# Patient Record
Sex: Female | Born: 1970 | Race: White | Hispanic: No | Marital: Married | Smoking: Never smoker
Health system: Southern US, Community
[De-identification: ages and names within clinical notes are randomized; demographics above are authoritative.]

## PROBLEM LIST (undated history)

## (undated) DIAGNOSIS — G43909 Migraine, unspecified, not intractable, without status migrainosus: Secondary | ICD-10-CM

## (undated) DIAGNOSIS — E282 Polycystic ovarian syndrome: Secondary | ICD-10-CM

## (undated) HISTORY — PX: TONSILLECTOMY: SUR1361

---

## 1997-12-20 ENCOUNTER — Ambulatory Visit (HOSPITAL_COMMUNITY): Admission: RE | Admit: 1997-12-20 | Discharge: 1997-12-20 | Payer: Self-pay | Admitting: Obstetrics and Gynecology

## 1997-12-20 ENCOUNTER — Inpatient Hospital Stay (HOSPITAL_COMMUNITY): Admission: AD | Admit: 1997-12-20 | Discharge: 1997-12-20 | Payer: Self-pay | Admitting: Obstetrics and Gynecology

## 1998-01-09 ENCOUNTER — Inpatient Hospital Stay (HOSPITAL_COMMUNITY): Admission: AD | Admit: 1998-01-09 | Discharge: 1998-01-11 | Payer: Self-pay | Admitting: Obstetrics and Gynecology

## 1998-02-06 ENCOUNTER — Other Ambulatory Visit: Admission: RE | Admit: 1998-02-06 | Discharge: 1998-02-06 | Payer: Self-pay | Admitting: Obstetrics and Gynecology

## 1999-02-06 ENCOUNTER — Other Ambulatory Visit: Admission: RE | Admit: 1999-02-06 | Discharge: 1999-02-06 | Payer: Self-pay | Admitting: Obstetrics and Gynecology

## 1999-09-24 ENCOUNTER — Other Ambulatory Visit: Admission: RE | Admit: 1999-09-24 | Discharge: 1999-09-24 | Payer: Self-pay | Admitting: Obstetrics and Gynecology

## 2000-03-15 ENCOUNTER — Inpatient Hospital Stay (HOSPITAL_COMMUNITY): Admission: AD | Admit: 2000-03-15 | Discharge: 2000-03-15 | Payer: Self-pay | Admitting: Obstetrics and Gynecology

## 2000-04-18 ENCOUNTER — Inpatient Hospital Stay (HOSPITAL_COMMUNITY): Admission: AD | Admit: 2000-04-18 | Discharge: 2000-04-21 | Payer: Self-pay | Admitting: Obstetrics and Gynecology

## 2000-04-22 ENCOUNTER — Encounter: Admission: RE | Admit: 2000-04-22 | Discharge: 2000-06-22 | Payer: Self-pay | Admitting: Obstetrics and Gynecology

## 2000-05-24 ENCOUNTER — Other Ambulatory Visit: Admission: RE | Admit: 2000-05-24 | Discharge: 2000-05-24 | Payer: Self-pay | Admitting: Obstetrics and Gynecology

## 2001-09-14 ENCOUNTER — Other Ambulatory Visit: Admission: RE | Admit: 2001-09-14 | Discharge: 2001-09-14 | Payer: Self-pay | Admitting: Obstetrics and Gynecology

## 2002-09-22 ENCOUNTER — Other Ambulatory Visit: Admission: RE | Admit: 2002-09-22 | Discharge: 2002-09-22 | Payer: Self-pay | Admitting: Obstetrics and Gynecology

## 2003-10-04 ENCOUNTER — Other Ambulatory Visit: Admission: RE | Admit: 2003-10-04 | Discharge: 2003-10-04 | Payer: Self-pay | Admitting: Obstetrics and Gynecology

## 2005-01-19 ENCOUNTER — Other Ambulatory Visit: Admission: RE | Admit: 2005-01-19 | Discharge: 2005-01-19 | Payer: Self-pay | Admitting: Obstetrics and Gynecology

## 2009-05-30 ENCOUNTER — Encounter: Admission: RE | Admit: 2009-05-30 | Discharge: 2009-05-30 | Payer: Self-pay | Admitting: Obstetrics and Gynecology

## 2012-02-24 ENCOUNTER — Other Ambulatory Visit: Payer: Self-pay | Admitting: Family Medicine

## 2012-02-24 DIAGNOSIS — R1011 Right upper quadrant pain: Secondary | ICD-10-CM

## 2012-02-26 ENCOUNTER — Ambulatory Visit
Admission: RE | Admit: 2012-02-26 | Discharge: 2012-02-26 | Disposition: A | Discharge status: Home / Self Care | Payer: BC Managed Care – PPO | Source: Ambulatory Visit | Attending: Family Medicine | Admitting: Family Medicine

## 2012-02-26 DIAGNOSIS — R1011 Right upper quadrant pain: Secondary | ICD-10-CM

## 2012-03-15 ENCOUNTER — Other Ambulatory Visit: Payer: Self-pay | Admitting: Family Medicine

## 2012-03-15 DIAGNOSIS — N1330 Unspecified hydronephrosis: Secondary | ICD-10-CM

## 2012-03-16 ENCOUNTER — Ambulatory Visit
Admission: RE | Admit: 2012-03-16 | Discharge: 2012-03-16 | Disposition: A | Discharge status: Home / Self Care | Payer: BC Managed Care – PPO | Source: Ambulatory Visit | Attending: Family Medicine | Admitting: Family Medicine

## 2012-03-16 DIAGNOSIS — N1330 Unspecified hydronephrosis: Secondary | ICD-10-CM

## 2013-06-18 ENCOUNTER — Emergency Department (HOSPITAL_COMMUNITY)
Admission: EM | Admit: 2013-06-18 | Discharge: 2013-06-18 | Disposition: A | Discharge status: Home / Self Care | Payer: BC Managed Care – PPO | Attending: Emergency Medicine | Admitting: Emergency Medicine

## 2013-06-18 ENCOUNTER — Emergency Department (HOSPITAL_COMMUNITY): Payer: BC Managed Care – PPO

## 2013-06-18 ENCOUNTER — Encounter (HOSPITAL_COMMUNITY): Payer: Self-pay | Admitting: Emergency Medicine

## 2013-06-18 DIAGNOSIS — M545 Low back pain, unspecified: Secondary | ICD-10-CM | POA: Insufficient documentation

## 2013-06-18 DIAGNOSIS — Z8742 Personal history of other diseases of the female genital tract: Secondary | ICD-10-CM | POA: Insufficient documentation

## 2013-06-18 DIAGNOSIS — R112 Nausea with vomiting, unspecified: Secondary | ICD-10-CM | POA: Insufficient documentation

## 2013-06-18 DIAGNOSIS — R109 Unspecified abdominal pain: Secondary | ICD-10-CM | POA: Insufficient documentation

## 2013-06-18 DIAGNOSIS — IMO0002 Reserved for concepts with insufficient information to code with codable children: Secondary | ICD-10-CM | POA: Insufficient documentation

## 2013-06-18 DIAGNOSIS — Z8679 Personal history of other diseases of the circulatory system: Secondary | ICD-10-CM | POA: Insufficient documentation

## 2013-06-18 DIAGNOSIS — Z3202 Encounter for pregnancy test, result negative: Secondary | ICD-10-CM | POA: Insufficient documentation

## 2013-06-18 HISTORY — DX: Migraine, unspecified, not intractable, without status migrainosus: G43.909

## 2013-06-18 HISTORY — DX: Polycystic ovarian syndrome: E28.2

## 2013-06-18 LAB — CBC WITH DIFFERENTIAL/PLATELET
BASOS ABS: 0 10*3/uL (ref 0.0–0.1)
BASOS PCT: 0 % (ref 0–1)
EOS PCT: 1 % (ref 0–5)
Eosinophils Absolute: 0.1 10*3/uL (ref 0.0–0.7)
HEMATOCRIT: 43 % (ref 36.0–46.0)
HEMOGLOBIN: 14.5 g/dL (ref 12.0–15.0)
LYMPHS PCT: 17 % (ref 12–46)
Lymphs Abs: 1.9 10*3/uL (ref 0.7–4.0)
MCH: 29.7 pg (ref 26.0–34.0)
MCHC: 33.7 g/dL (ref 30.0–36.0)
MCV: 88.1 fL (ref 78.0–100.0)
Monocytes Absolute: 0.6 10*3/uL (ref 0.1–1.0)
Monocytes Relative: 5 % (ref 3–12)
NEUTROS ABS: 8.7 10*3/uL — AB (ref 1.7–7.7)
NEUTROS PCT: 77 % (ref 43–77)
Platelets: 235 10*3/uL (ref 150–400)
RBC: 4.88 MIL/uL (ref 3.87–5.11)
RDW: 13 % (ref 11.5–15.5)
WBC: 11.4 10*3/uL — ABNORMAL HIGH (ref 4.0–10.5)

## 2013-06-18 LAB — I-STAT CHEM 8, ED
BUN: 10 mg/dL (ref 6–23)
CREATININE: 0.9 mg/dL (ref 0.50–1.10)
Calcium, Ion: 1.24 mmol/L — ABNORMAL HIGH (ref 1.12–1.23)
Chloride: 101 mEq/L (ref 96–112)
Glucose, Bld: 102 mg/dL — ABNORMAL HIGH (ref 70–99)
HCT: 45 % (ref 36.0–46.0)
HEMOGLOBIN: 15.3 g/dL — AB (ref 12.0–15.0)
POTASSIUM: 4 meq/L (ref 3.7–5.3)
SODIUM: 141 meq/L (ref 137–147)
TCO2: 29 mmol/L (ref 0–100)

## 2013-06-18 LAB — URINALYSIS, ROUTINE W REFLEX MICROSCOPIC
GLUCOSE, UA: NEGATIVE mg/dL
KETONES UR: 15 mg/dL — AB
NITRITE: NEGATIVE
PH: 6.5 (ref 5.0–8.0)
PROTEIN: 30 mg/dL — AB
SPECIFIC GRAVITY, URINE: 1.029 (ref 1.005–1.030)
Urobilinogen, UA: 1 mg/dL (ref 0.0–1.0)

## 2013-06-18 LAB — URINE MICROSCOPIC-ADD ON

## 2013-06-18 LAB — PREGNANCY, URINE: PREG TEST UR: NEGATIVE

## 2013-06-18 MED ORDER — HYDROMORPHONE HCL PF 1 MG/ML IJ SOLN
1.0000 mg | Freq: Once | INTRAMUSCULAR | Status: AC
  Administered 2013-06-18: 1 mg via INTRAVENOUS
  Filled 2013-06-18: qty 1

## 2013-06-18 MED ORDER — HYDROMORPHONE HCL PF 1 MG/ML IJ SOLN
0.5000 mg | Freq: Once | INTRAMUSCULAR | Status: AC
  Administered 2013-06-18: 0.5 mg via INTRAVENOUS
  Filled 2013-06-18: qty 1

## 2013-06-18 MED ORDER — ONDANSETRON 8 MG PO TBDP: 8.0000 mg | ORAL_TABLET | Freq: Three times a day (TID) | ORAL | Status: AC | PRN

## 2013-06-18 MED ORDER — ONDANSETRON HCL 4 MG/2ML IJ SOLN
4.0000 mg | Freq: Once | INTRAMUSCULAR | Status: AC
  Administered 2013-06-18: 4 mg via INTRAVENOUS
  Filled 2013-06-18: qty 2

## 2013-06-18 MED ORDER — PROMETHAZINE HCL 25 MG/ML IJ SOLN
25.0000 mg | Freq: Once | INTRAMUSCULAR | Status: AC
  Administered 2013-06-18: 25 mg via INTRAVENOUS
  Filled 2013-06-18: qty 1

## 2013-06-18 MED ORDER — OXYCODONE-ACETAMINOPHEN 5-325 MG PO TABS: 1.0000 | ORAL_TABLET | Freq: Four times a day (QID) | ORAL | Status: AC | PRN

## 2013-06-18 MED ORDER — KETOROLAC TROMETHAMINE 30 MG/ML IJ SOLN
30.0000 mg | Freq: Once | INTRAMUSCULAR | Status: AC
  Administered 2013-06-18: 30 mg via INTRAVENOUS
  Filled 2013-06-18: qty 1

## 2013-06-18 NOTE — ED Notes (Signed)
Pt ambulated to restroom with no difficulty.  Husband by side.

## 2013-06-18 NOTE — ED Notes (Signed)
Pt reports about 1 hr ago developed sharp left sided flank pain. Had episode of vomiting. Normal bowel movement. No problems with urination. Pt tried to take a bath at home, with no relief of pain. States pain is now dull instead of sharp. Pt pale. Holding Left flank.

## 2013-06-18 NOTE — ED Notes (Signed)
Pt became nauseated after administration of toradol.  Pt started throwing up with RN at bedside.  EDP made aware.  Phenergan to be ordered.

## 2013-06-18 NOTE — ED Provider Notes (Signed)
CSN: [id number]     Arrival date & time 06/18/13  1543 History   First MD Initiated Contact with Patient 06/18/13 1613     Chief Complaint  Patient presents with  . Flank Pain     (Consider location/radiation/quality/duration/timing/severity/associated sxs/prior Treatment) Patient is a 43 y.o. female presenting with flank pain. The history is provided by the patient.  Flank Pain This is a new problem. Pertinent negatives include no chest pain, no abdominal pain, no headaches and no shortness of breath.  patient presents with left lower back pain that radiates to the front. Began prior to arrival. No relief with that, although it has changed from a sharp to dull pain. No fevers. No dysuria. She's had a normal bowel movement. No history of known kidney stones, however there was once where she had right upper quadrant pain that showed some swollen kidney that resolved and it was considered likely kidney stone. She has never had a CT scan for it. She's had nausea and vomiting due to the pain. The pain came on while she was cleaning the house. She denies dysuria. She denies vaginal bleeding or discharge or  Past Medical History  Diagnosis Date  . Polycystic ovaries   . Migraines    Past Surgical History  Procedure Laterality Date  . Tonsillectomy     No family history on file. History  Substance Use Topics  . Smoking status: Never Smoker   . Smokeless tobacco: Not on file  . Alcohol Use: No   OB History   Grav Para Term Preterm Abortions TAB SAB Ect Mult Living                 Review of Systems  Constitutional: Negative for activity change and appetite change.  Eyes: Negative for pain.  Respiratory: Negative for chest tightness and shortness of breath.   Cardiovascular: Negative for chest pain and leg swelling.  Gastrointestinal: Positive for nausea and vomiting. Negative for abdominal pain and diarrhea.  Genitourinary: Positive for flank pain.  Musculoskeletal: Negative for back  pain and neck stiffness.  Skin: Negative for rash.  Neurological: Negative for weakness, numbness and headaches.  Psychiatric/Behavioral: Negative for behavioral problems.      Allergies  Review of patient's allergies indicates no known allergies.  Home Medications   Current Outpatient Rx  Name  Route  Sig  Dispense  Refill  . fluticasone (FLONASE) 50 MCG/ACT nasal spray   Each Nare   Place 1 spray into both nostrils daily.         Marland Kitchen ibuprofen (ADVIL,MOTRIN) 200 MG tablet   Oral   Take 400 mg by mouth every 6 (six) hours as needed.         . ondansetron (ZOFRAN-ODT) 8 MG disintegrating tablet   Oral   Take 1 tablet (8 mg total) by mouth every 8 (eight) hours as needed for nausea or vomiting.   10 tablet   0   . oxyCODONE-acetaminophen (PERCOCET/ROXICET) 5-325 MG per tablet   Oral   Take 1-2 tablets by mouth every 6 (six) hours as needed for severe pain.   10 tablet   0    BP 117/72  Pulse 82  Temp(Src) 98.1 F (36.7 C) (Oral)  Resp 18  Wt 170 lb (77.111 kg)  SpO2 100%  LMP 05/21/2013 Physical Exam  Nursing note and vitals reviewed. Constitutional: She is oriented to person, place, and time. She appears well-developed and well-nourished.  Patient appears uncomfortable  HENT:  Head: Normocephalic  and atraumatic.  Eyes: EOM are normal. Pupils are equal, round, and reactive to light.  Neck: Normal range of motion. Neck supple.  Cardiovascular: Normal rate, regular rhythm and normal heart sounds.   No murmur heard. Pulmonary/Chest: Effort normal and breath sounds normal. No respiratory distress. She has no wheezes. She has no rales.  Abdominal: Soft. Bowel sounds are normal. She exhibits no distension. There is no tenderness. There is no rebound and no guarding.  Genitourinary:  Left CVA tenderness.   Musculoskeletal: Normal range of motion.  Neurological: She is alert and oriented to person, place, and time. No cranial nerve deficit.  Skin: Skin is warm and  dry.  Psychiatric: She has a normal mood and affect. Her speech is normal.    ED Course  Procedures (including critical care time) Labs Review Labs Reviewed  CBC WITH DIFFERENTIAL - Abnormal; Notable for the following:    WBC 11.4 (*)    Neutro Abs 8.7 (*)    All other components within normal limits  URINALYSIS, ROUTINE W REFLEX MICROSCOPIC - Abnormal; Notable for the following:    APPearance CLOUDY (*)    Hgb urine dipstick SMALL (*)    Bilirubin Urine SMALL (*)    Ketones, ur 15 (*)    Protein, ur 30 (*)    Leukocytes, UA MODERATE (*)    All other components within normal limits  URINE MICROSCOPIC-ADD ON - Abnormal; Notable for the following:    Squamous Epithelial / LPF FEW (*)    All other components within normal limits  I-STAT CHEM 8, ED - Abnormal; Notable for the following:    Glucose, Bld 102 (*)    Calcium, Ion 1.24 (*)    Hemoglobin 15.3 (*)    All other components within normal limits  PREGNANCY, URINE   Imaging Review Ct Abdomen Pelvis Wo Contrast  06/18/2013   CLINICAL DATA:  Left flank pain and vomiting.  EXAM: CT ABDOMEN AND PELVIS WITHOUT CONTRAST  TECHNIQUE: Multidetector CT imaging of the abdomen and pelvis was performed following the standard protocol without intravenous contrast.  COMPARISON:  US RENAL dated 03/16/2012; US ABDOMEN LIMITED RUQ/ASCITES dated 02/26/2012  FINDINGS: Lung Bases: Dependent atelectasis, greater on the right than left.  Liver: Unenhanced CT was performed per clinician order. Lack of IV contrast limits sensitivity and specificity, especially for evaluation of abdominal/pelvic solid viscera. Grossly normal.  Spleen:  Normal.  Gallbladder:  Normal.  Common bile duct:  Normal.  Pancreas:  Normal.  Adrenal glands:  Normal.  Kidneys: Normal appearance of the kidneys. Ectasia of the left ureter. This can be associated with ascending urinary tract infection. Correlation with urinalysis recommended.  Stomach:  Tiny hiatal hernia.  The stomach is  mostly collapsed.  Small bowel:  Normal.  Colon:   Normal appendix.  Distal colon appears normal.  Pelvic Genitourinary: Physiologic appearance of the uterus and adnexa.  Bones:  No aggressive osseous lesions.  Vasculature: Normal.  Body Wall: Normal.  IMPRESSION: Mild ectasia of the left ureter which can be associated with ascending urinary tract infection. Correlation with urinalysis recommended.   Electronically Signed   By: Andreas NewportGeoffrey  Lamke M.D.   On: 06/18/2013 19:14     EKG Interpretation None      MDM   Final diagnoses:  Flank pain    Patient with left flank pain. Urine shows a little bit of blood. There is mild swelling of the left ureter,. She may have passed a stone. No infection. Will discharge home  to followup as needed.    Juliet Rude. Rubin Payor, MD 06/19/13 4098

## 2013-06-18 NOTE — Discharge Instructions (Signed)

## 2014-01-02 ENCOUNTER — Other Ambulatory Visit: Payer: Self-pay | Admitting: Gastroenterology

## 2014-01-02 DIAGNOSIS — R1033 Periumbilical pain: Secondary | ICD-10-CM

## 2014-01-10 ENCOUNTER — Ambulatory Visit
Admission: RE | Admit: 2014-01-10 | Discharge: 2014-01-10 | Disposition: A | Discharge status: Home / Self Care | Payer: BC Managed Care – PPO | Source: Ambulatory Visit | Attending: Gastroenterology | Admitting: Gastroenterology

## 2014-01-10 DIAGNOSIS — R1033 Periumbilical pain: Secondary | ICD-10-CM

## 2014-01-22 ENCOUNTER — Other Ambulatory Visit (HOSPITAL_COMMUNITY): Payer: Self-pay | Admitting: Gastroenterology

## 2014-01-22 DIAGNOSIS — R140 Abdominal distension (gaseous): Secondary | ICD-10-CM

## 2014-01-31 ENCOUNTER — Ambulatory Visit (HOSPITAL_COMMUNITY)
Admission: RE | Admit: 2014-01-31 | Discharge: 2014-01-31 | Disposition: A | Discharge status: Home / Self Care | Payer: BC Managed Care – PPO | Source: Ambulatory Visit | Attending: Gastroenterology | Admitting: Gastroenterology

## 2014-01-31 DIAGNOSIS — R140 Abdominal distension (gaseous): Secondary | ICD-10-CM

## 2014-01-31 DIAGNOSIS — K219 Gastro-esophageal reflux disease without esophagitis: Secondary | ICD-10-CM | POA: Diagnosis not present

## 2014-01-31 MED ORDER — TECHNETIUM TC 99M SULFUR COLLOID
2.0000 | Freq: Once | INTRAVENOUS | Status: AC | PRN
  Administered 2014-01-31: 2 via INTRAVENOUS

## 2014-11-06 IMAGING — US US ABDOMEN LIMITED
1 series · 14 of 25 positions shown · non-contrast
Comparison: None

CLINICAL DATA: Right upper quadrant abdominal pain.

LIMITED ABDOMINAL ULTRASOUND - RIGHT UPPER QUADRANT

[Series 1: us abdomen limited · 0.33mm/px · 14 of 44 slices shown]
[im 1/44]
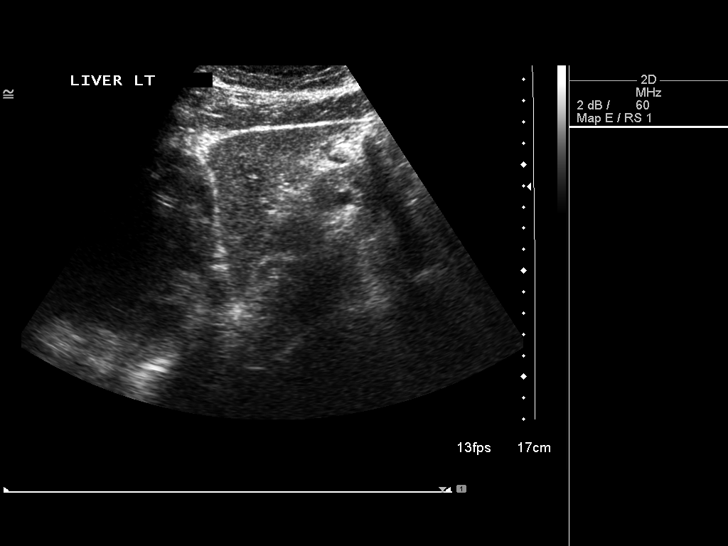
[im 4/44]
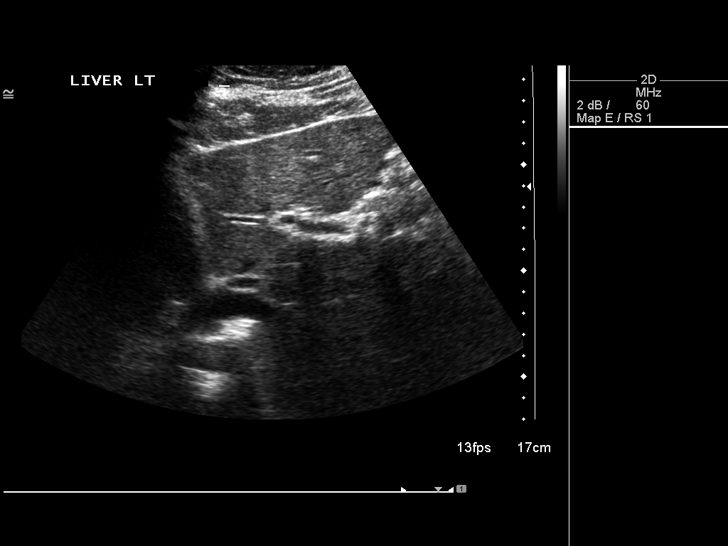
[im 8/44]
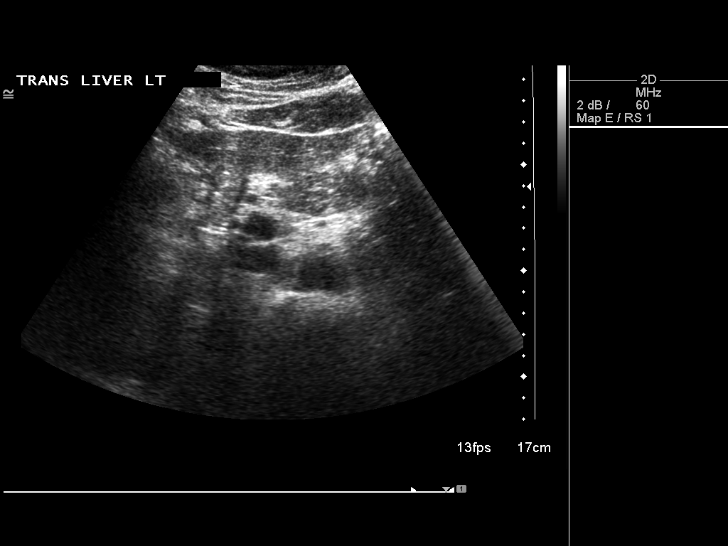
[im 11/44]
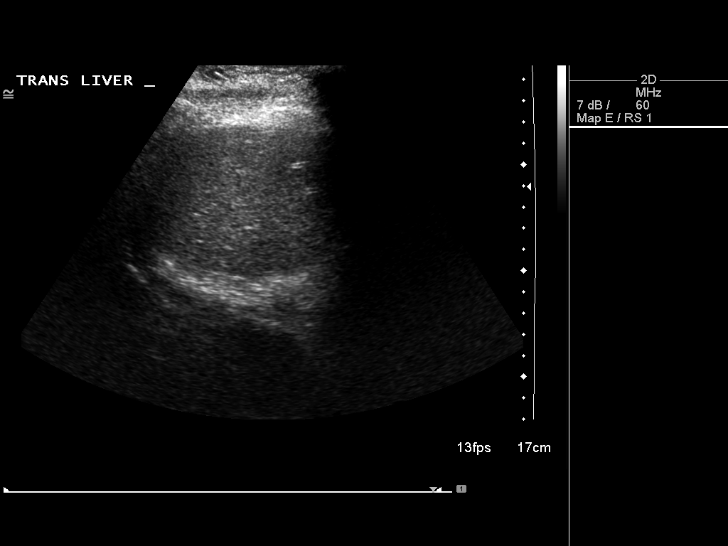
[im 15/44]
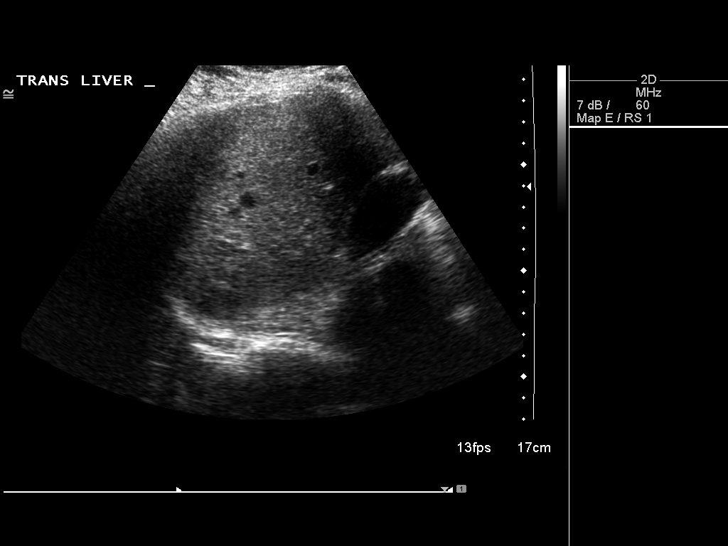
[im 17/44]
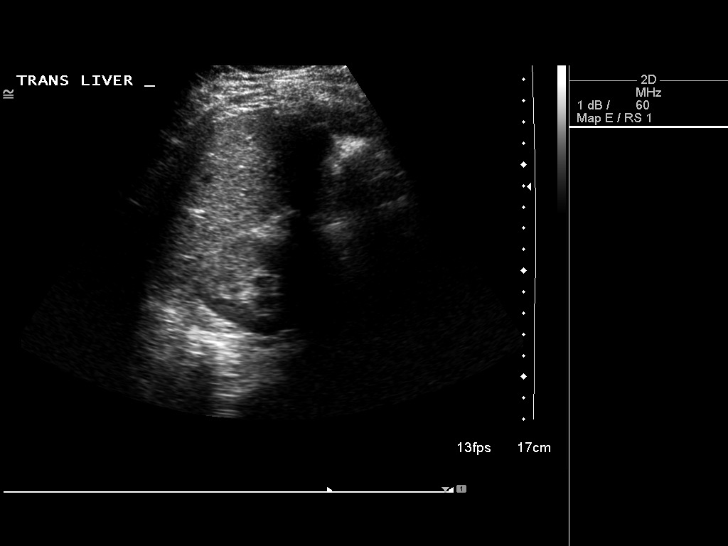
[im 20/44]
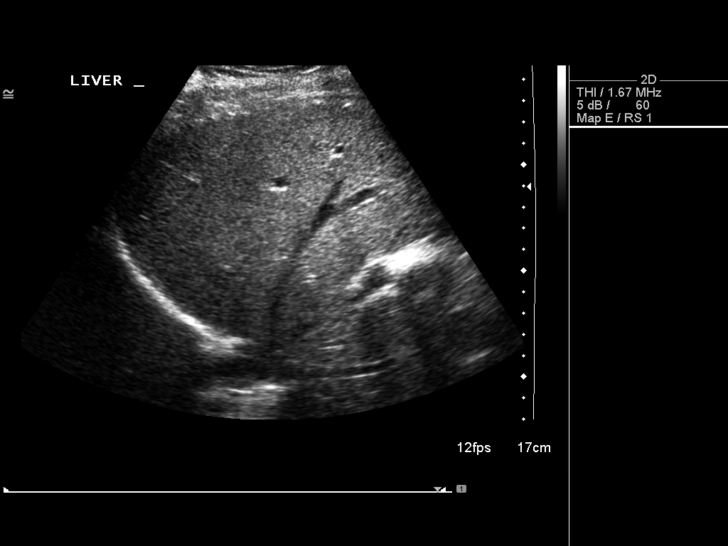
[im 24/44]
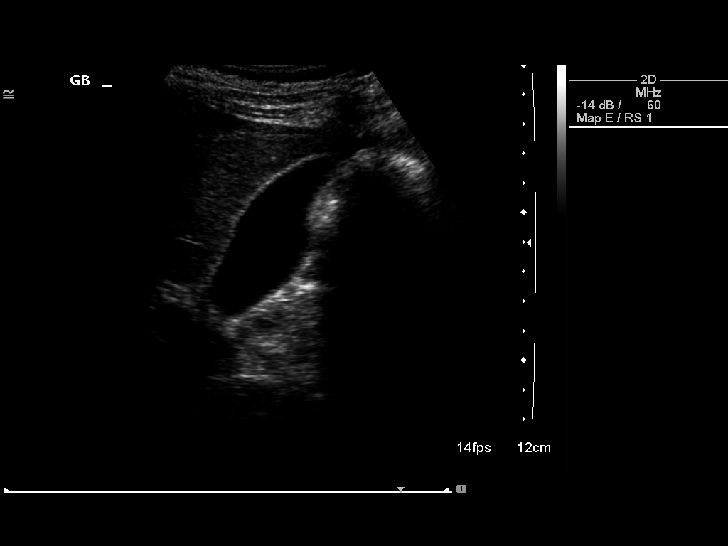
[im 27/44]
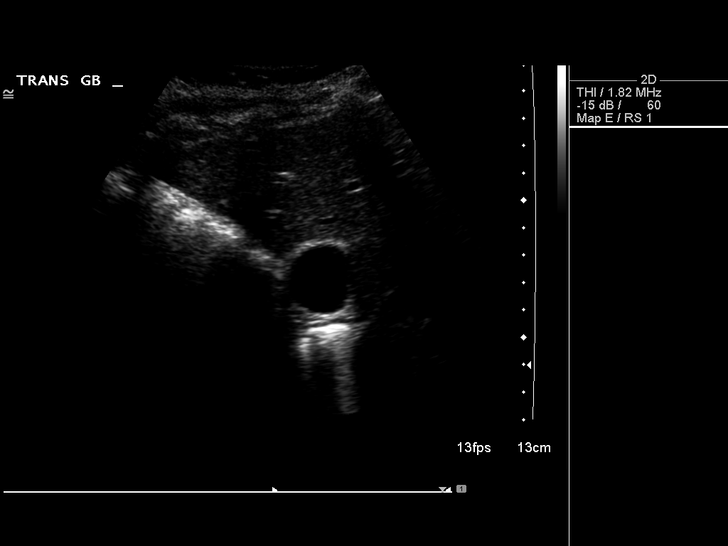
[im 29/44]
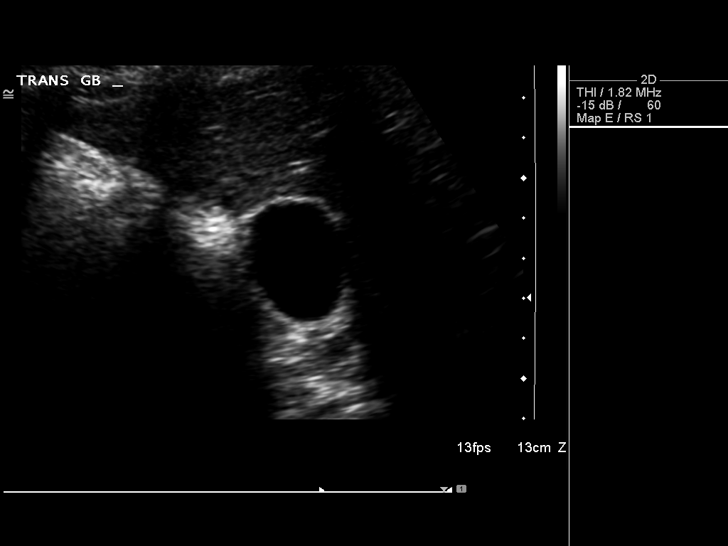
[im 33/44]
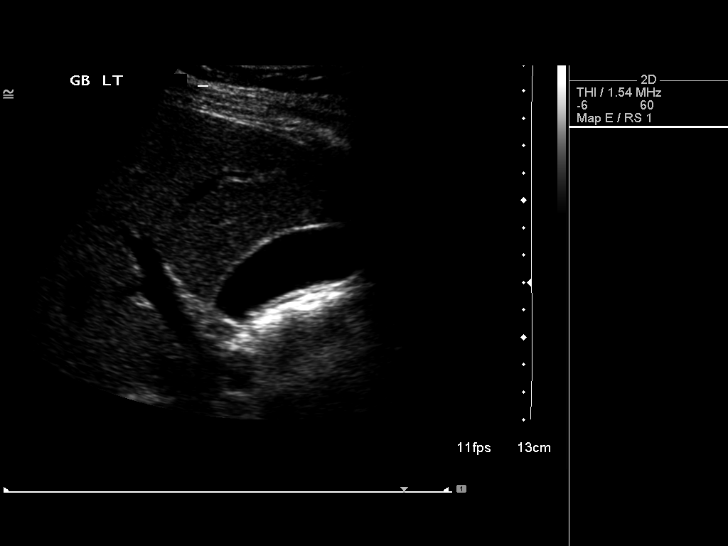
[im 36/44]
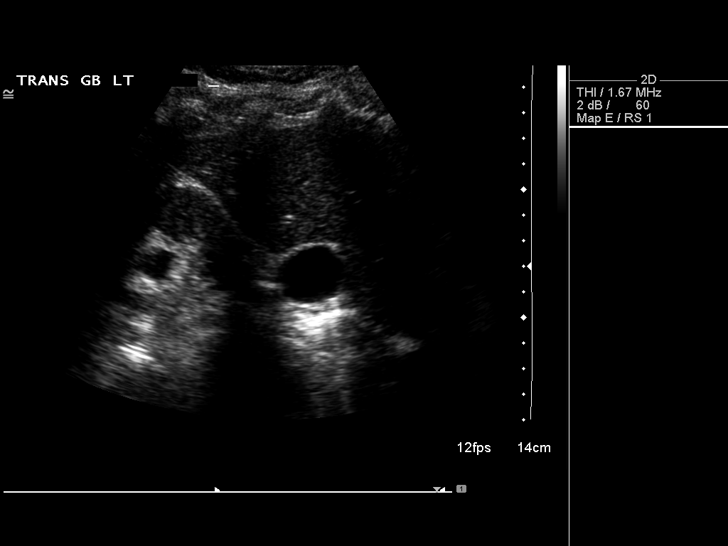
[im 40/44]
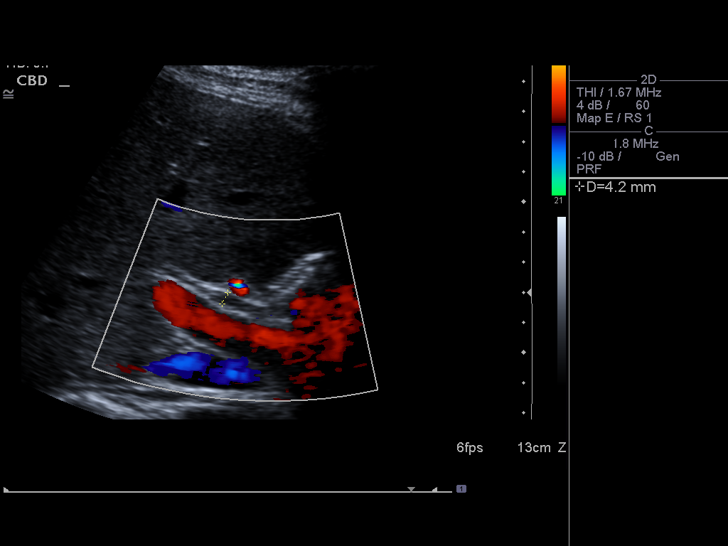
[im 44/44]
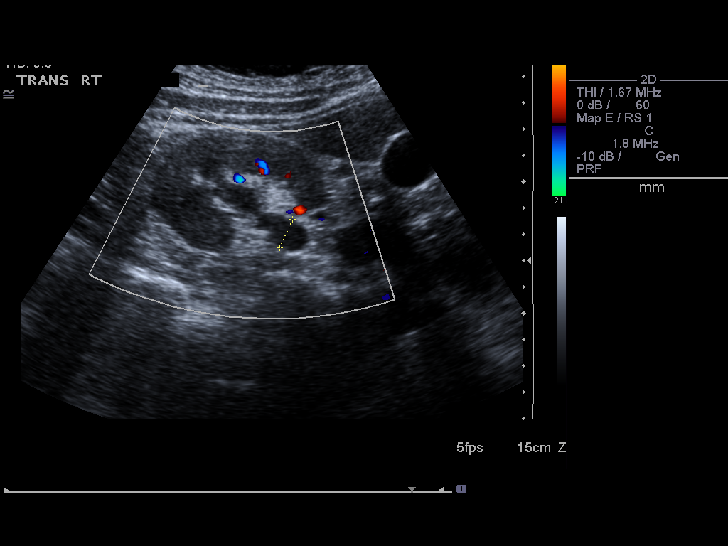

[14 of 25 positions shown; findings below may reference images not displayed]

FINDINGS: Gallbladder:  No gallstones, wall thickening or pericholecystic
fluid.  Negative sonographic Murphy's sign.

Common bile duct:  Normal in caliber measuring a maximum of 4.2mm.

Liver:  The liver is sonographically unremarkable.  There is normal
echogenicity without focal lesions or intrahepatic biliary
dilatation.

Additional findings:  Mild hydronephrosis of the right kidney is
noted.
IMPRESSION: 1.  Normal sonographic appearance of the liver and gallbladder.
2.  Normal caliber common bile duct.
3.  Mild hydronephrosis of the right kidney.

## 2014-11-25 IMAGING — US US RENAL
1 series · 14 of 25 positions shown · non-contrast
Comparison: 02/26/2012

CLINICAL DATA: FOLLOW UP RIGHT HYDRONEPHROSIS

RENAL/URINARY TRACT ULTRASOUND COMPLETE

[Series 1: us renal · 0.24mm/px · 14 of 31 slices shown]
[im 1/31]
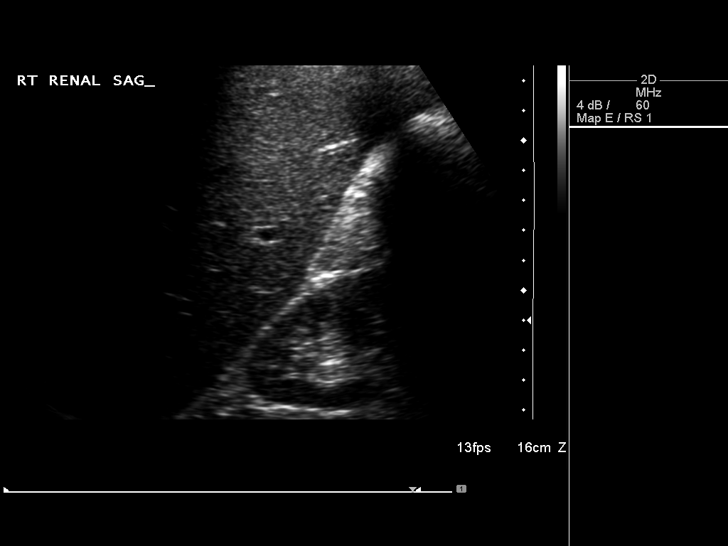
[im 3/31]
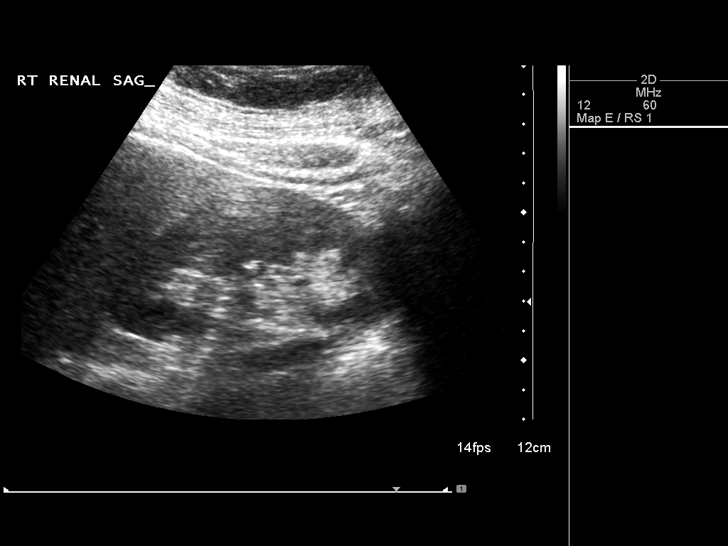
[im 6/31]
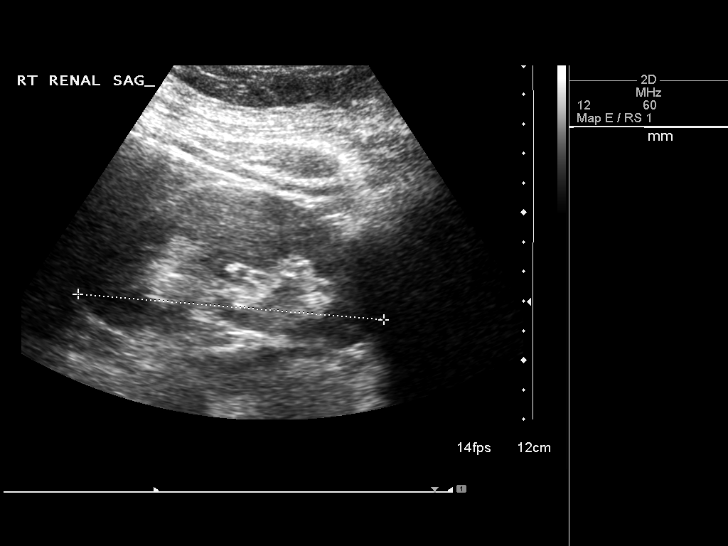
[im 8/31]
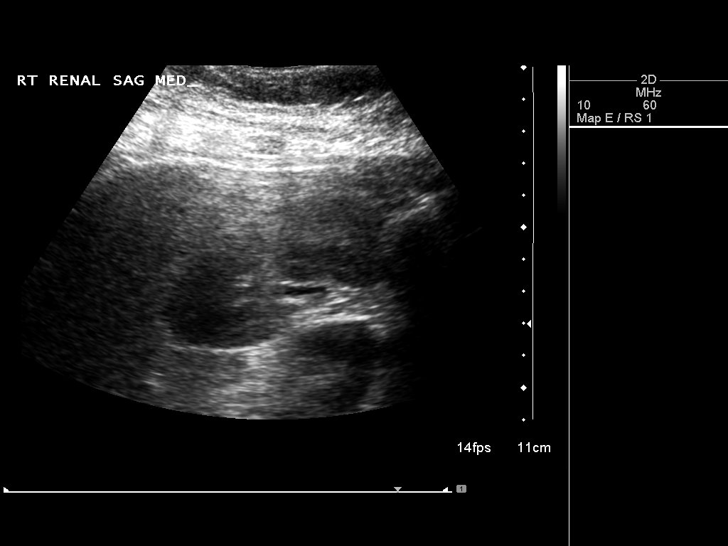
[im 11/31]
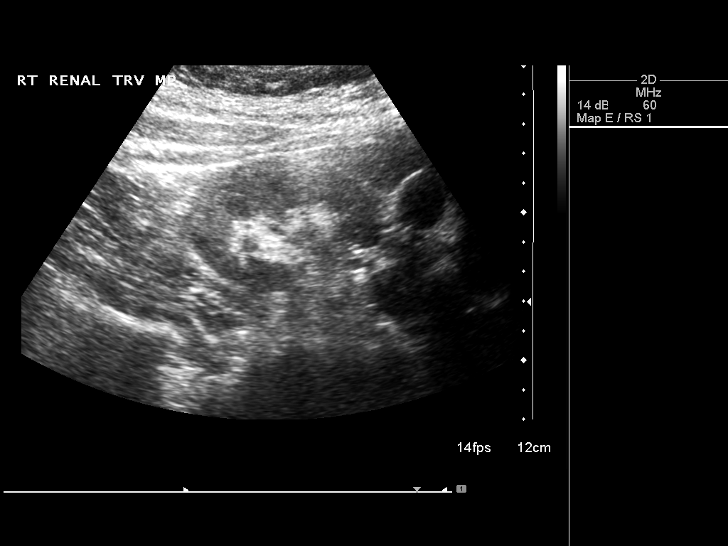
[im 12/31]
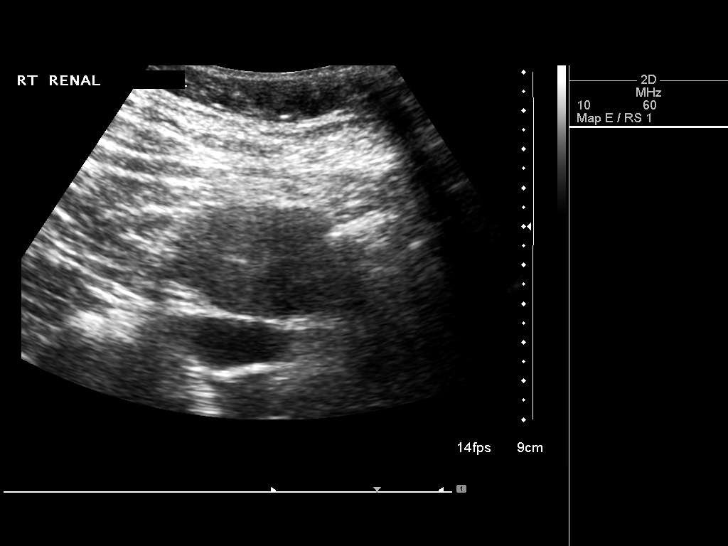
[im 14/31]
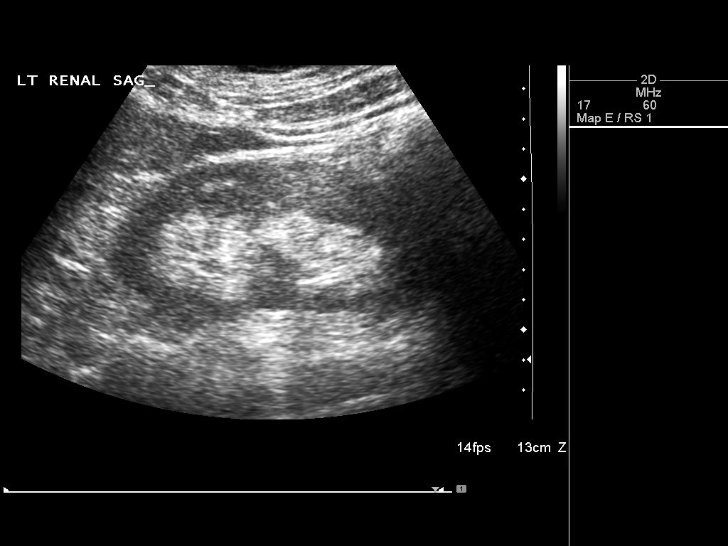
[im 17/31]
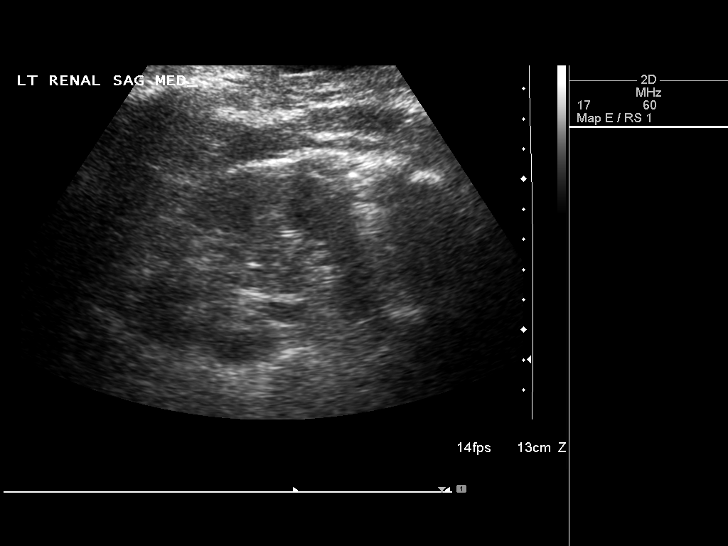
[im 19/31]
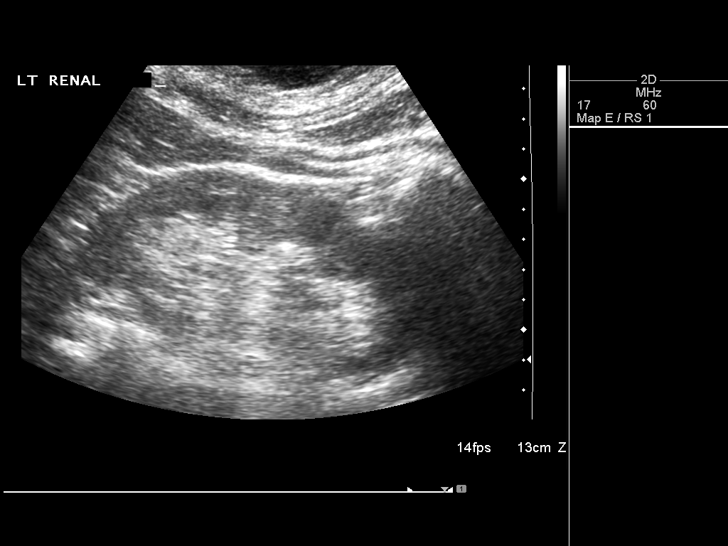
[im 21/31]
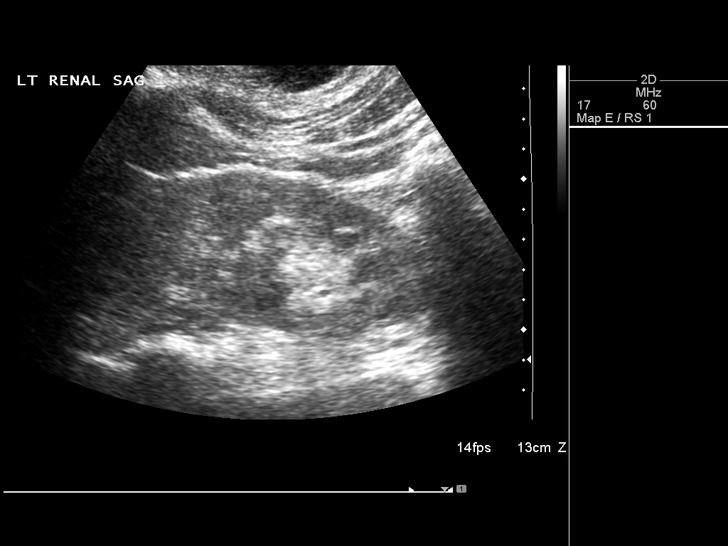
[im 23/31]
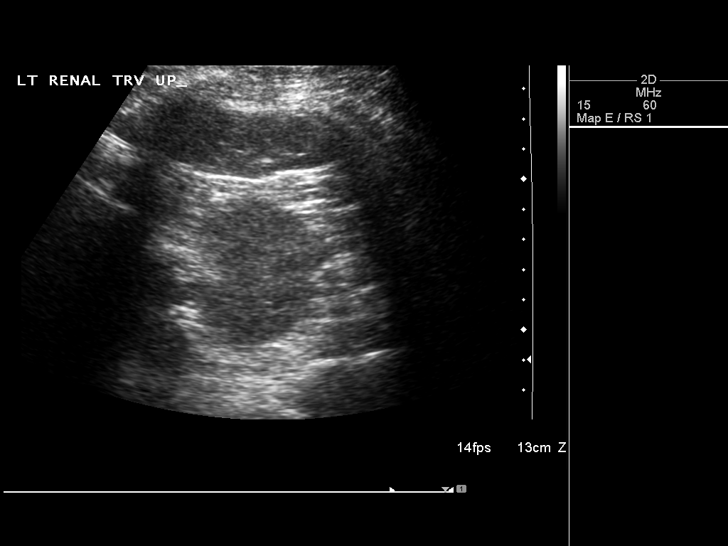
[im 26/31]
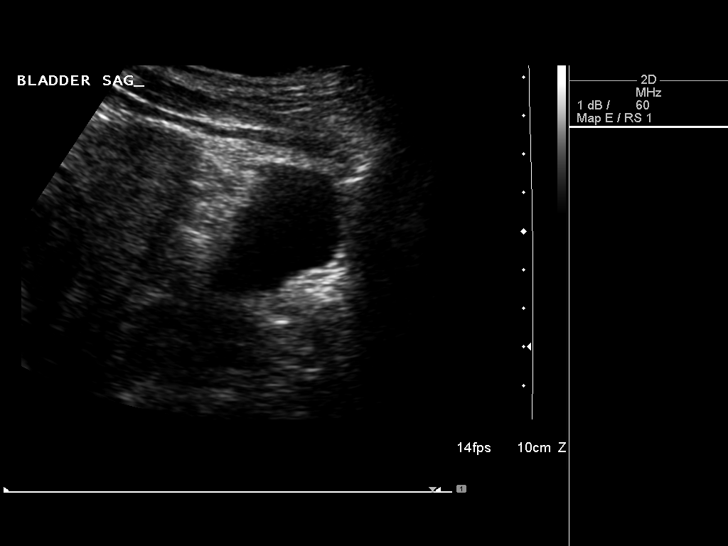
[im 28/31]
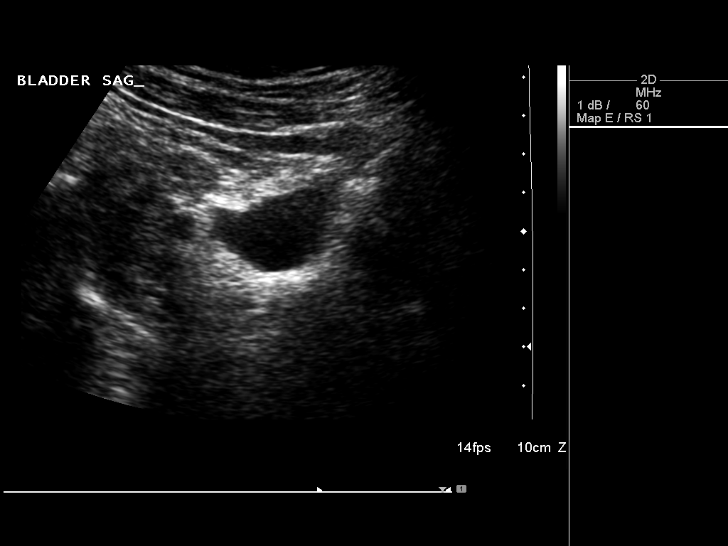
[im 31/31]
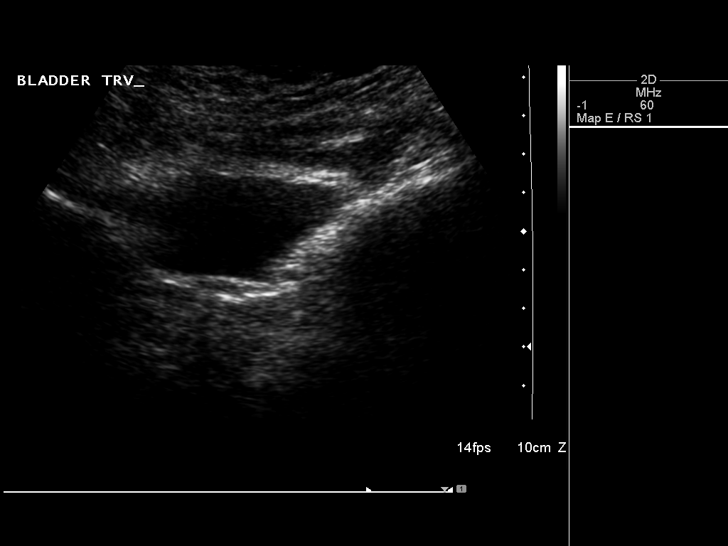

[14 of 25 positions shown; findings below may reference images not displayed]

FINDINGS: Right Kidney:  Measures 10.4 cm in length, with normal echogenicity
and echotexture.  Prior hydronephrosis has resolved.  No calculus,
or mass identified.

Left Kidney:  Measures 11.7 cm in length and likewise appears
normal.

Bladder:  Small in caliber, without mass observed.
IMPRESSION: 1.  Prior right hydronephrosis has resolved.  Both kidneys appear
sonographically normal.

## 2016-04-27 DIAGNOSIS — J329 Chronic sinusitis, unspecified: Secondary | ICD-10-CM | POA: Diagnosis not present

## 2016-09-20 IMAGING — US US ABDOMEN COMPLETE
1 series · 14 of 25 positions shown · non-contrast
Comparison: CT, 06/18/2013

CLINICAL DATA: Periumbilical abdominal pain.

EXAM:
ULTRASOUND ABDOMEN COMPLETE

[Series 1: us abdomen complete · 0.29mm/px · 14 of 84 slices shown]
[im 1/84]
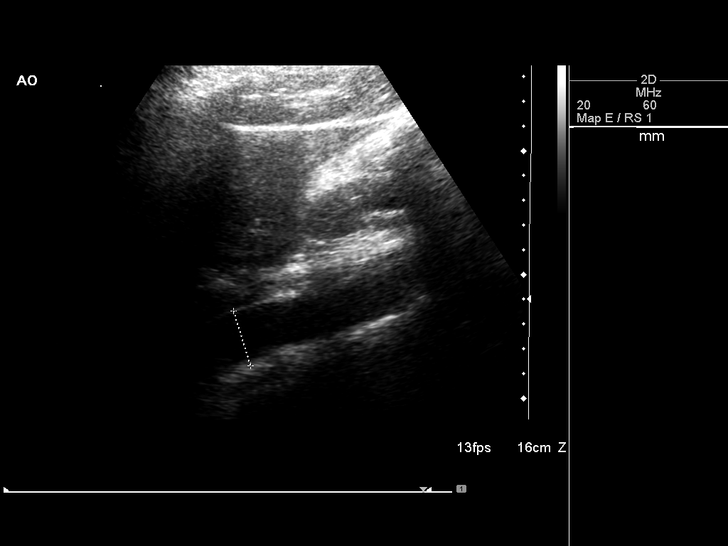
[im 7/84]
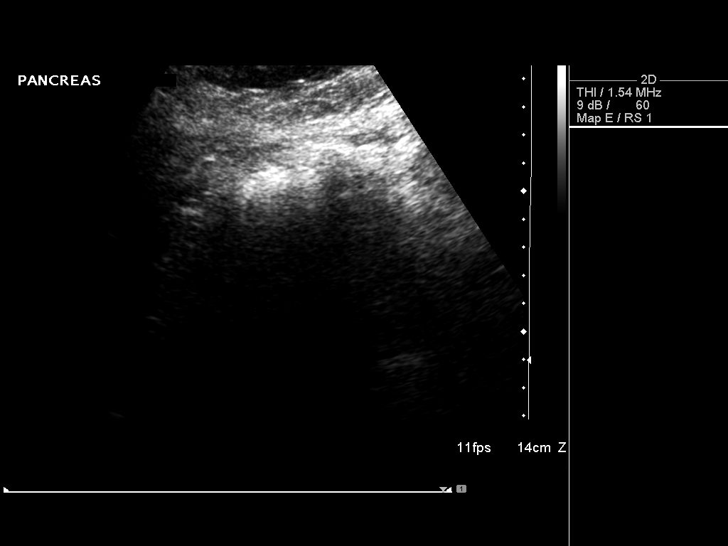
[im 14/84]
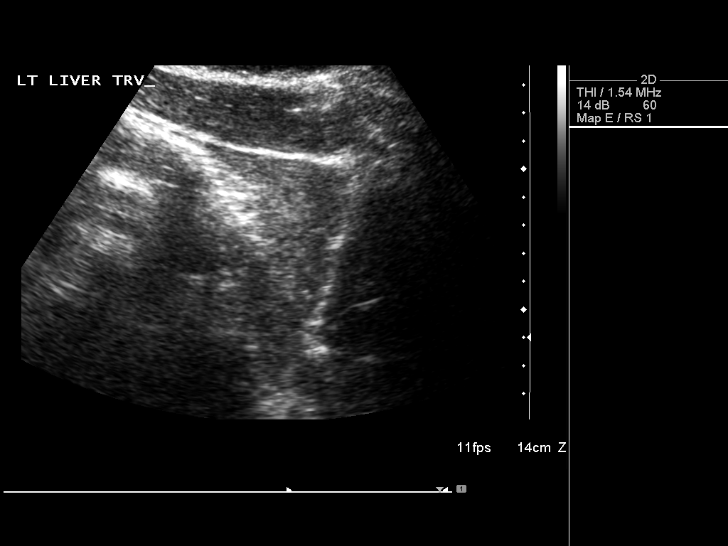
[im 21/84]
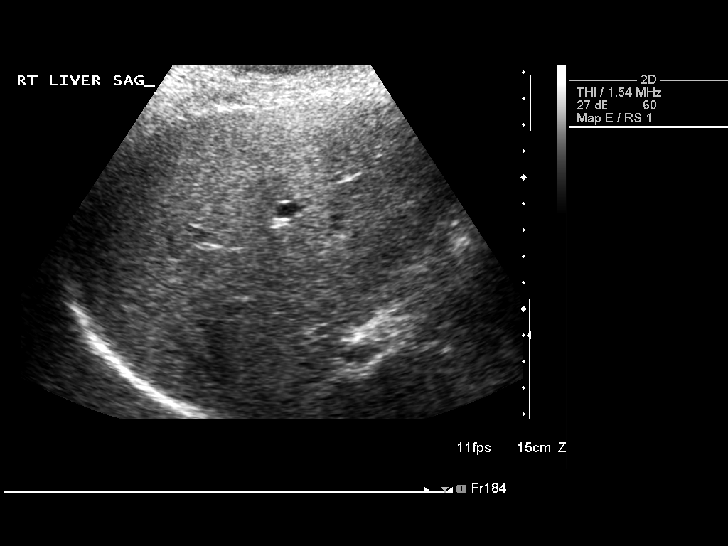
[im 28/84]
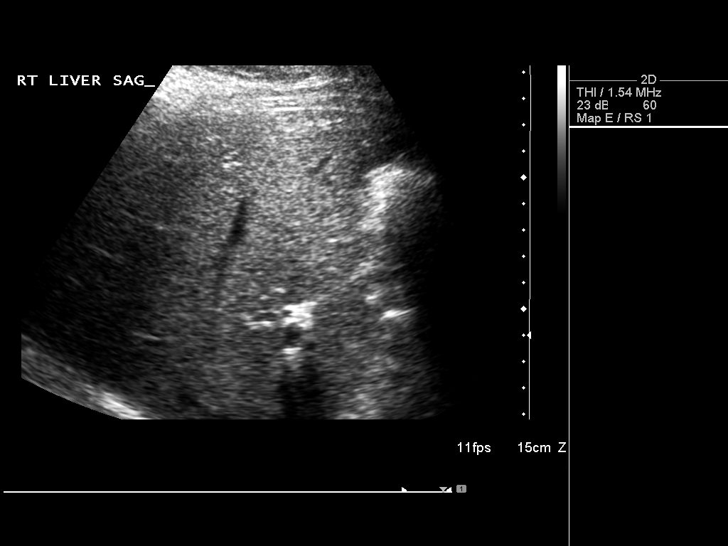
[im 32/84]
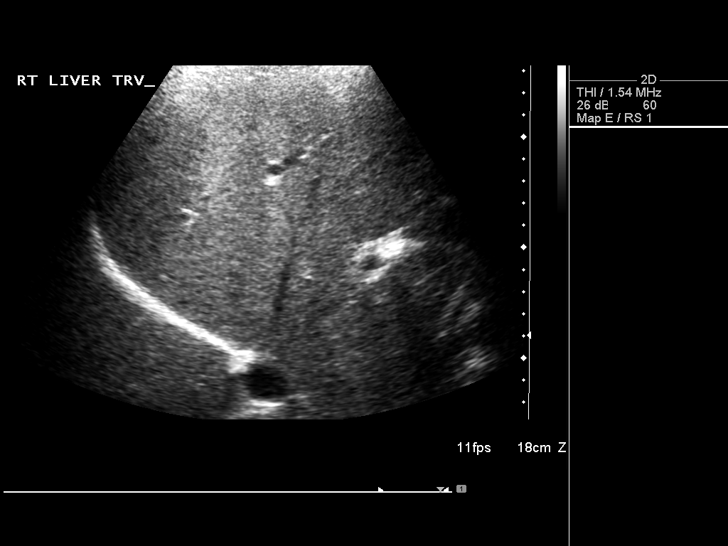
[im 39/84]
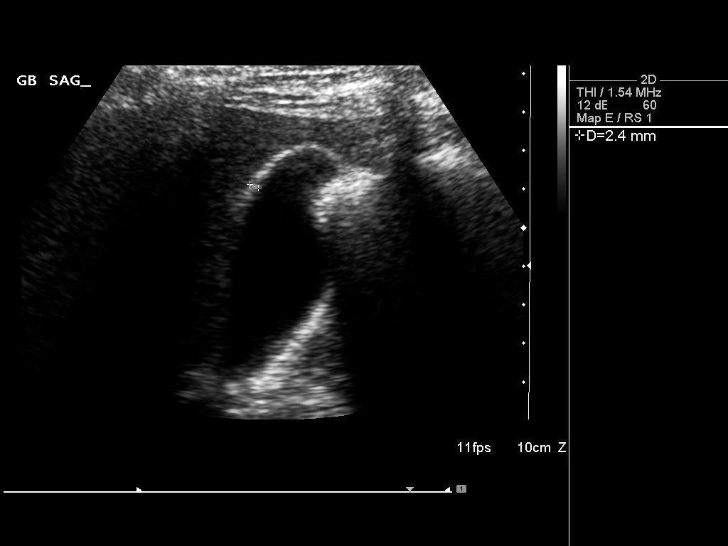
[im 45/84]
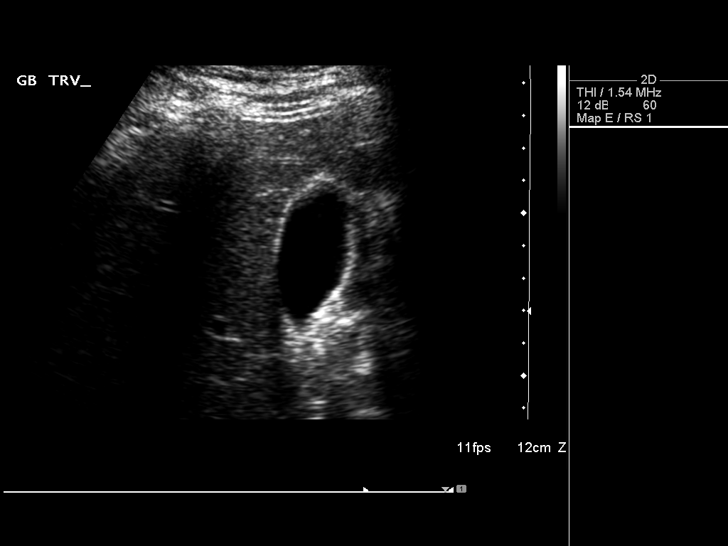
[im 52/84]
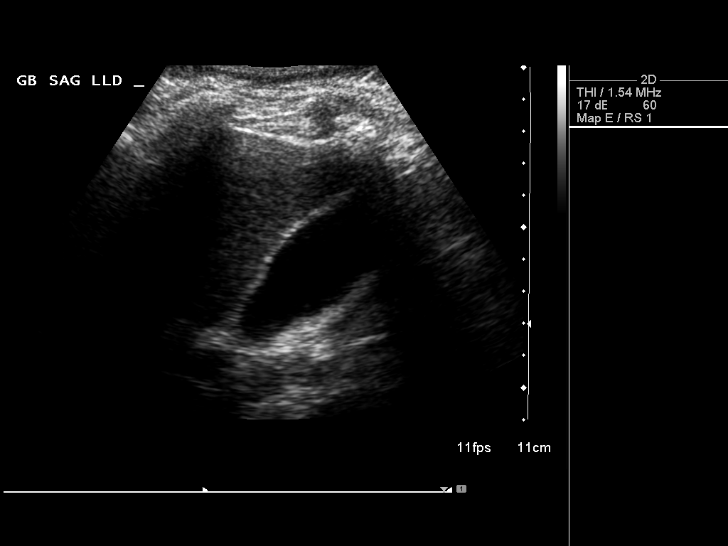
[im 56/84]
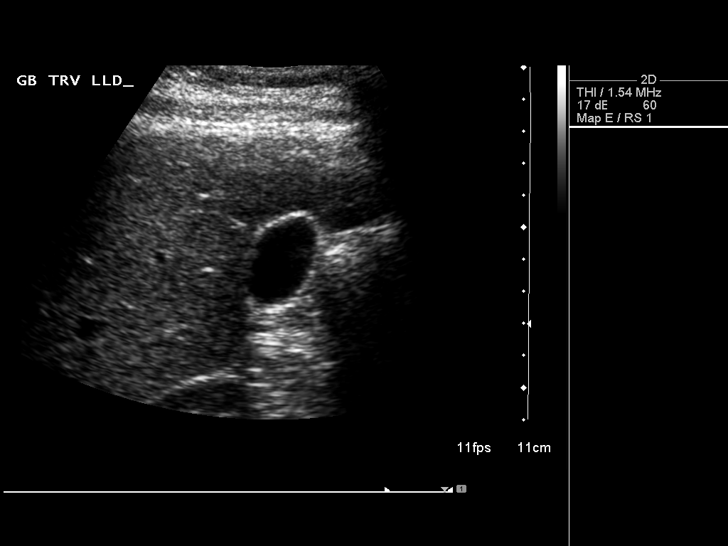
[im 63/84]
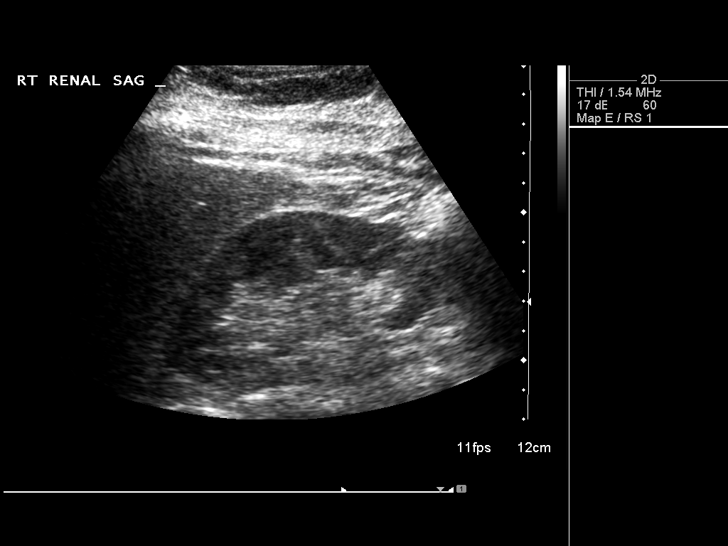
[im 70/84]
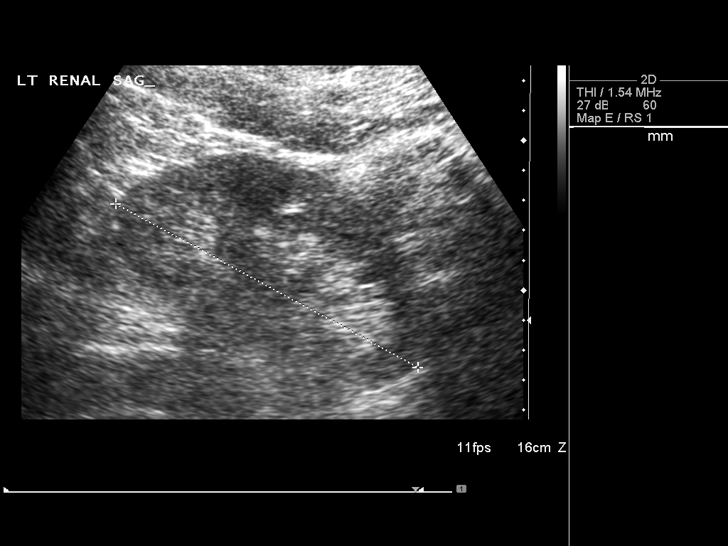
[im 77/84]
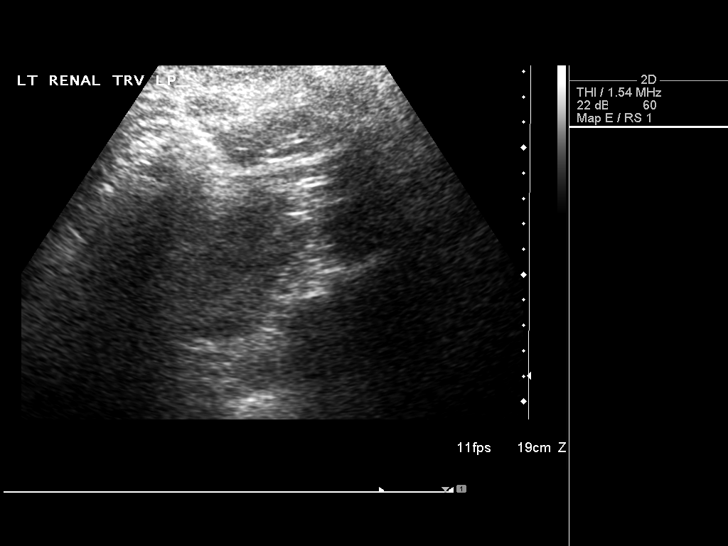
[im 84/84]
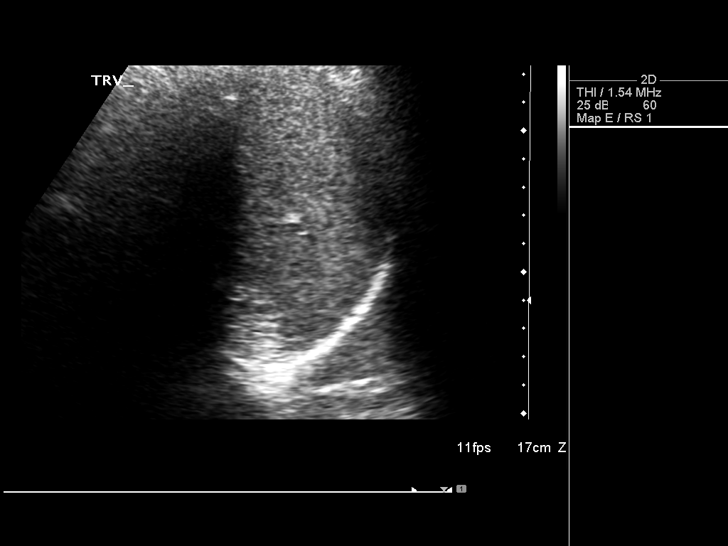

[14 of 25 positions shown; findings below may reference images not displayed]

FINDINGS: Gallbladder:

No gallstones or wall thickening visualized. No sonographic Murphy
sign noted.

Common bile duct:

Diameter: 3.5 mm

Liver:

No focal lesion identified. Within normal limits in parenchymal
echogenicity.

IVC:

No abnormality visualized.

Pancreas:

Visualized portion unremarkable.

Spleen:

Size and appearance within normal limits.

Right Kidney:

Length: 10.5 cm. Echogenicity within normal limits. No mass or
hydronephrosis visualized.

Left Kidney:

Length: 11.5 cm. Echogenicity within normal limits. No mass or
hydronephrosis visualized.

Abdominal aorta:

No aneurysm visualized.

Other findings:

None.
IMPRESSION: Normal abdominal ultrasound.

## 2017-11-23 DIAGNOSIS — Z01419 Encounter for gynecological examination (general) (routine) without abnormal findings: Secondary | ICD-10-CM | POA: Diagnosis not present

## 2017-11-23 DIAGNOSIS — Z6826 Body mass index (BMI) 26.0-26.9, adult: Secondary | ICD-10-CM | POA: Diagnosis not present

## 2017-11-29 DIAGNOSIS — Z1231 Encounter for screening mammogram for malignant neoplasm of breast: Secondary | ICD-10-CM | POA: Diagnosis not present

## 2019-07-21 ENCOUNTER — Ambulatory Visit: Payer: Self-pay | Attending: Internal Medicine

## 2019-07-21 DIAGNOSIS — Z23 Encounter for immunization: Secondary | ICD-10-CM

## 2019-07-21 NOTE — Progress Notes (Signed)
   Covid-19 Vaccination Clinic  Name:  JANYIAH SILVERI    MRN: [id number] DOB: [birth year]  07/21/2019  Ms. Markoff was observed post Covid-19 immunization for 15 minutes without incident. She was provided with Vaccine Information Sheet and instruction to access the V-Safe system.   Ms. Piscopo was instructed to call 911 with any severe reactions post vaccine: Marland Kitchen Difficulty breathing  . Swelling of face and throat  . A fast heartbeat  . A bad rash all over body  . Dizziness and weakness   Immunizations Administered    Name Date Dose VIS Date Route   Pfizer COVID-19 Vaccine 07/21/2019  3:39 PM 0.3 mL 03/31/2019 Intramuscular   Manufacturer: [employer], [employer]   Lot: [id number]   NDC: [address]-8

## 2019-08-12 ENCOUNTER — Ambulatory Visit: Payer: Self-pay | Attending: Internal Medicine

## 2019-08-12 DIAGNOSIS — Z23 Encounter for immunization: Secondary | ICD-10-CM

## 2019-08-12 NOTE — Progress Notes (Signed)
   Covid-19 Vaccination Clinic  Name:  WALDA HERTZOG    MRN: [id number] DOB: 1970/09/08  08/12/2019  Ms. Propp was observed post Covid-19 immunization for 15 minutes without incident. She was provided with Vaccine Information Sheet and instruction to access the V-Safe system.   Ms. Prestigiacomo was instructed to call 911 with any severe reactions post vaccine: Marland Kitchen Difficulty breathing  . Swelling of face and throat  . A fast heartbeat  . A bad rash all over body  . Dizziness and weakness   Immunizations Administered    Name Date Dose VIS Date Route   Pfizer COVID-19 Vaccine 08/12/2019  9:53 AM 0.3 mL 06/14/2018 Intramuscular   Manufacturer: [employer], [employer]   Lot: [id number]   NDC: [address]-8
